# Patient Record
Sex: Female | Born: 2000 | Race: White | Hispanic: No | Marital: Single | Smoking: Never smoker
Health system: Southern US, Community
[De-identification: ages and names within clinical notes are randomized; demographics above are authoritative.]

## PROBLEM LIST (undated history)

## (undated) ENCOUNTER — Emergency Department (HOSPITAL_BASED_OUTPATIENT_CLINIC_OR_DEPARTMENT_OTHER): Discharge status: Absent without leave | Payer: BLUE CROSS/BLUE SHIELD

## (undated) DIAGNOSIS — S060XAA Concussion with loss of consciousness status unknown, initial encounter: Secondary | ICD-10-CM

## (undated) HISTORY — PX: WISDOM TOOTH EXTRACTION: SHX21

## (undated) HISTORY — DX: Concussion with loss of consciousness status unknown, initial encounter: S06.0XAA

---
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
## 2021-01-01 ENCOUNTER — Encounter: Payer: Self-pay | Admitting: *Deleted

## 2021-01-01 ENCOUNTER — Ambulatory Visit: Payer: BC Managed Care – PPO | Admitting: Neurology

## 2021-01-01 DIAGNOSIS — S060X0A Concussion without loss of consciousness, initial encounter: Secondary | ICD-10-CM

## 2021-01-01 DIAGNOSIS — R419 Unspecified symptoms and signs involving cognitive functions and awareness: Secondary | ICD-10-CM

## 2021-01-01 DIAGNOSIS — R410 Disorientation, unspecified: Secondary | ICD-10-CM | POA: Diagnosis not present

## 2021-01-01 NOTE — Telephone Encounter (Signed)
Letter given to pt today as was here for EEG.

## 2021-01-02 ENCOUNTER — Telehealth: Payer: Self-pay | Admitting: Neurology

## 2021-01-02 NOTE — Telephone Encounter (Signed)
[employer] Harvey: [id number] (exp. 01/02/21 to 01/31/21)   Patient is scheduled at [facility] for 01/03/21. Patient mom was asking if the EEG has been read yet and would like the results.   Also, the patient mom stated she would like something for her daughter to have because she may be claustrophobic for the MRI.  She is aware that the patient needs a driver.

## 2021-01-03 ENCOUNTER — Telehealth: Payer: Self-pay | Admitting: *Deleted

## 2021-01-03 ENCOUNTER — Ambulatory Visit: Payer: BC Managed Care – PPO

## 2021-01-03 DIAGNOSIS — R419 Unspecified symptoms and signs involving cognitive functions and awareness: Secondary | ICD-10-CM | POA: Diagnosis not present

## 2021-01-03 DIAGNOSIS — R42 Dizziness and giddiness: Secondary | ICD-10-CM

## 2021-01-03 DIAGNOSIS — I670 Dissection of cerebral arteries, nonruptured: Secondary | ICD-10-CM | POA: Diagnosis not present

## 2021-01-03 DIAGNOSIS — R41840 Attention and concentration deficit: Secondary | ICD-10-CM

## 2021-01-03 DIAGNOSIS — S1980XA Other specified injuries of unspecified part of neck, initial encounter: Secondary | ICD-10-CM

## 2021-01-03 DIAGNOSIS — R442 Other hallucinations: Secondary | ICD-10-CM

## 2021-01-03 DIAGNOSIS — R410 Disorientation, unspecified: Secondary | ICD-10-CM

## 2021-01-03 DIAGNOSIS — S060X0A Concussion without loss of consciousness, initial encounter: Secondary | ICD-10-CM

## 2021-01-03 DIAGNOSIS — R200 Anesthesia of skin: Secondary | ICD-10-CM

## 2021-01-03 MED ORDER — GADOBENATE DIMEGLUMINE 529 MG/ML IV SOLN
15.0000 mL | Freq: Once | INTRAVENOUS | Status: AC | PRN
  Administered 2021-01-03: 15 mL via INTRAVENOUS

## 2021-01-03 NOTE — Telephone Encounter (Signed)
EEG was Monday 10/24

## 2021-01-03 NOTE — Telephone Encounter (Signed)
-----   Message from Anson Fret, MD sent at 01/03/2021  2:12 PM EDT ----- Let patient know EEG is normal. We discussed if EEG normal, setting her up for outpatient 48-72 hour eeg. If she is still willing, can you ask her and let me know so we can place order? Thanks!

## 2021-01-03 NOTE — Procedures (Signed)
    History:  39 yof here with headaches, dizzy spells s/p fall and concussion  EEG classification: Normal awake and drowsy  Description of the recording: The background rhythms of this recording consists of a fairly well modulated medium amplitude alpha rhythm of 10-11 Hz that is reactive to eye opening and closure. As the record progresses, the patient appears to remain in the waking state throughout the recording. Photic stimulation was performed, did not show any abnormalities. Hyperventilation was also performed, did not show any abnormalities. Toward the end of the recording, the patient enters the drowsy state with slight symmetric slowing seen. The patient never enters stage II sleep. No abnormal epileptiform discharges seen during this recording. There was no focal slowing. EKG monitor shows no evidence of cardiac rhythm abnormalities with a heart rate of 104.  Impression: This is a normal EEG recording in the waking and drowsy state. No evidence of interictal epileptiform discharges seen. A normal EEG does not exclude a diagnosis of epilepsy.    Windell Norfolk, MD [facility]

## 2021-01-03 NOTE — Telephone Encounter (Signed)
I called pt and relayed results of normal EEG, and Dr. Trevor Mace message about VEEG 24-72 hour if pt is willing. Mother stated will wait to for MRI results first then will proceed if needed.

## 2022-02-02 ENCOUNTER — Encounter (HOSPITAL_BASED_OUTPATIENT_CLINIC_OR_DEPARTMENT_OTHER): Payer: Self-pay | Admitting: Emergency Medicine

## 2022-02-02 ENCOUNTER — Emergency Department (HOSPITAL_BASED_OUTPATIENT_CLINIC_OR_DEPARTMENT_OTHER)
Admission: EM | Admit: 2022-02-02 | Discharge: 2022-02-02 | Disposition: A | Discharge status: Home / Self Care | Payer: BC Managed Care – PPO | Attending: Emergency Medicine | Admitting: Emergency Medicine

## 2022-02-02 ENCOUNTER — Other Ambulatory Visit: Payer: Self-pay

## 2022-02-02 ENCOUNTER — Emergency Department (HOSPITAL_BASED_OUTPATIENT_CLINIC_OR_DEPARTMENT_OTHER): Payer: BC Managed Care – PPO

## 2022-02-02 DIAGNOSIS — R112 Nausea with vomiting, unspecified: Secondary | ICD-10-CM | POA: Diagnosis not present

## 2022-02-02 DIAGNOSIS — R1013 Epigastric pain: Secondary | ICD-10-CM | POA: Insufficient documentation

## 2022-02-02 LAB — URINALYSIS, ROUTINE W REFLEX MICROSCOPIC
Bilirubin Urine: NEGATIVE
Glucose, UA: NEGATIVE mg/dL
Ketones, ur: NEGATIVE mg/dL
Leukocytes,Ua: NEGATIVE
Nitrite: NEGATIVE
Protein, ur: NEGATIVE mg/dL
Specific Gravity, Urine: 1.02 (ref 1.005–1.030)
pH: 6.5 (ref 5.0–8.0)

## 2022-02-02 LAB — CBC
HCT: 41.5 % (ref 36.0–46.0)
Hemoglobin: 14.1 g/dL (ref 12.0–15.0)
MCH: 29.9 pg (ref 26.0–34.0)
MCHC: 34 g/dL (ref 30.0–36.0)
MCV: 88.1 fL (ref 80.0–100.0)
Platelets: 333 10*3/uL (ref 150–400)
RBC: 4.71 MIL/uL (ref 3.87–5.11)
RDW: 12.1 % (ref 11.5–15.5)
WBC: 9.7 10*3/uL (ref 4.0–10.5)
nRBC: 0 % (ref 0.0–0.2)

## 2022-02-02 LAB — COMPREHENSIVE METABOLIC PANEL WITH GFR
ALT: 11 U/L (ref 0–44)
AST: 18 U/L (ref 15–41)
Albumin: 3.8 g/dL (ref 3.5–5.0)
Alkaline Phosphatase: 43 U/L (ref 38–126)
Anion gap: 9 (ref 5–15)
BUN: 8 mg/dL (ref 6–20)
CO2: 23 mmol/L (ref 22–32)
Calcium: 8.4 mg/dL — ABNORMAL LOW (ref 8.9–10.3)
Chloride: 103 mmol/L (ref 98–111)
Creatinine, Ser: 0.61 mg/dL (ref 0.44–1.00)
GFR, Estimated: >60 mL/min
Glucose, Bld: 98 mg/dL (ref 70–99)
Potassium: 3.5 mmol/L (ref 3.5–5.1)
Sodium: 135 mmol/L (ref 135–145)
Total Bilirubin: 0.5 mg/dL (ref 0.3–1.2)
Total Protein: 7.9 g/dL (ref 6.5–8.1)

## 2022-02-02 LAB — URINALYSIS, MICROSCOPIC (REFLEX)

## 2022-02-02 LAB — LIPASE, BLOOD: Lipase: 33 U/L (ref 11–51)

## 2022-02-02 LAB — PREGNANCY, URINE: Preg Test, Ur: NEGATIVE

## 2022-02-02 MED ORDER — ALUM & MAG HYDROXIDE-SIMETH 200-200-20 MG/5ML PO SUSP
30.0000 mL | Freq: Once | ORAL | Status: AC
  Administered 2022-02-02: 30 mL via ORAL
  Filled 2022-02-02: qty 30

## 2022-02-02 MED ORDER — LACTATED RINGERS IV BOLUS
1000.0000 mL | Freq: Once | INTRAVENOUS | Status: AC
  Administered 2022-02-02: 1000 mL via INTRAVENOUS

## 2022-02-02 MED ORDER — IOHEXOL 300 MG/ML  SOLN
100.0000 mL | Freq: Once | INTRAMUSCULAR | Status: AC | PRN
  Administered 2022-02-02: 100 mL via INTRAVENOUS

## 2022-02-02 MED ORDER — FENTANYL CITRATE PF 50 MCG/ML IJ SOSY
50.0000 ug | PREFILLED_SYRINGE | Freq: Once | INTRAMUSCULAR | Status: AC
  Administered 2022-02-02: 50 ug via INTRAVENOUS
  Filled 2022-02-02: qty 1

## 2022-02-02 MED ORDER — MORPHINE SULFATE (PF) 4 MG/ML IV SOLN
4.0000 mg | Freq: Once | INTRAVENOUS | Status: AC
  Administered 2022-02-02: 4 mg via INTRAVENOUS
  Filled 2022-02-02: qty 1

## 2022-02-02 MED ORDER — KETOROLAC TROMETHAMINE 30 MG/ML IJ SOLN
15.0000 mg | Freq: Once | INTRAMUSCULAR | Status: AC
  Administered 2022-02-02: 15 mg via INTRAVENOUS
  Filled 2022-02-02: qty 1

## 2022-02-02 MED ORDER — DICYCLOMINE HCL 10 MG PO CAPS
10.0000 mg | ORAL_CAPSULE | Freq: Once | ORAL | Status: AC
  Administered 2022-02-02: 10 mg via ORAL
  Filled 2022-02-02: qty 1

## 2022-02-02 MED ORDER — ONDANSETRON HCL 4 MG/2ML IJ SOLN
4.0000 mg | Freq: Once | INTRAMUSCULAR | Status: AC
  Administered 2022-02-02: 4 mg via INTRAVENOUS
  Filled 2022-02-02: qty 2

## 2022-02-02 MED ORDER — DICYCLOMINE HCL 20 MG PO TABS: 20.0000 mg | ORAL_TABLET | Freq: Two times a day (BID) | ORAL | 0 refills | Status: AC | PRN

## 2022-02-02 MED ORDER — ALUM & MAG HYDROXIDE-SIMETH 400-400-40 MG/5ML PO SUSP: 15.0000 mL | Freq: Four times a day (QID) | ORAL | 0 refills | Status: AC | PRN

## 2022-02-02 NOTE — ED Provider Notes (Signed)
[facility] EMERGENCY DEPARTMENT Provider Note   CSN: [id number] Arrival date & time: 02/02/22  0038     History  Chief Complaint  Patient presents with   Abdominal Pain    Victoria Hensley is a 21 y.o. female.  21 yo F here with abdominal pain. Started yesterday morning, took meds and got better. Ate and drank throughout the day and did well but then pain returned last night. Epigastric wrapping around to her right side. Some nausea, and vomited after supper. No fevers but had an episode of chills. Now with persistent discomfort. No diarrhea. Last period 3 weeks ago and is on a different contraceptive.    Abdominal Pain      Home Medications Prior to Admission medications   Medication Sig Start Date End Date Taking? Authorizing Provider  alum & mag hydroxide-simeth (MAALOX PLUS) 400-400-40 MG/5ML suspension Take 15 mLs by mouth every 6 (six) hours as needed for indigestion. 02/02/22  Yes Jaisha Villacres, Barbara Cower, MD  dicyclomine (BENTYL) 20 MG tablet Take 1 tablet (20 mg total) by mouth 2 (two) times daily as needed for spasms (abdominal cramping). 02/02/22  Yes Osborn Pullin, Barbara Cower, MD  TRI-LO-MILI 0.18/0.215/0.25 MG-25 MCG tab Take 1 tablet by mouth daily. 02/01/22  Yes [provider]  ALPRAZolam (XANAX) 0.25 MG tablet Take 1-2 tabs (0.25mg -0.50mg ) 30-60 minutes before procedure. May repeat if needed.Do not drive. 12/26/20   Anson Fret, MD  loratadine (CLARITIN) 10 MG tablet Take 1 tablet (10 mg total) by mouth daily. 07/28/13   Carmelina Dane, MD  ofloxacin (FLOXIN) 0.3 % otic solution Place 5 drops into the right ear 2 (two) times daily. 07/28/13   Carmelina Dane, MD  ondansetron (ZOFRAN) 4 MG tablet Take 1 tablet (4 mg total) by mouth every 6 (six) hours. 09/07/20   Curatolo, Adam, DO  pantoprazole (PROTONIX) 20 MG tablet Take 1 tablet (20 mg total) by mouth daily for 14 days. 09/07/20 09/21/20  Curatolo, Adam, DO  polyethylene glycol (MIRALAX / GLYCOLAX) 17 g  packet Take 17 g by mouth daily. 09/07/20   Curatolo, Adam, DO      Allergies    Amoxil [amoxicillin]    Review of Systems   Review of Systems  Gastrointestinal:  Positive for abdominal pain.    Physical Exam Updated Vital Signs BP 122/64 (BP Location: Left Arm)   Pulse (!) 24   Temp 98.6 F (37 C) (Oral)   Resp 17   Ht 5\' 8"  (1.727 m)   Wt 79.4 kg   LMP 01/05/2022 (Approximate)   SpO2 99%   BMI 26.61 kg/m  Physical Exam Vitals and nursing note reviewed.  Constitutional:      Appearance: She is well-developed.  HENT:     Head: Normocephalic and atraumatic.  Cardiovascular:     Rate and Rhythm: Normal rate and regular rhythm.  Pulmonary:     Effort: Pulmonary effort is normal. No respiratory distress.     Breath sounds: No stridor.  Abdominal:     General: There is no distension.     Tenderness: There is no abdominal tenderness.     Hernia: No hernia is present.  Musculoskeletal:     Cervical back: Normal range of motion.  Skin:    General: Skin is warm and dry.  Neurological:     Mental Status: She is alert.     ED Results / Procedures / Treatments   Labs (all labs ordered are listed, but only abnormal results are displayed)  Labs Reviewed  COMPREHENSIVE METABOLIC PANEL - Abnormal; Notable for the following components:      Result Value   Calcium 8.4 (*)    All other components within normal limits  URINALYSIS, ROUTINE W REFLEX MICROSCOPIC - Abnormal; Notable for the following components:   Hgb urine dipstick TRACE (*)    All other components within normal limits  URINALYSIS, MICROSCOPIC (REFLEX) - Abnormal; Notable for the following components:   Bacteria, UA FEW (*)    All other components within normal limits  LIPASE, BLOOD  CBC  PREGNANCY, URINE    EKG None  Radiology CT ABDOMEN PELVIS W CONTRAST  Result Date: 02/02/2022 CLINICAL DATA:  Acute abdominal pain and chills for several hours, initial encounter EXAM: CT ABDOMEN AND PELVIS WITH  CONTRAST TECHNIQUE: Multidetector CT imaging of the abdomen and pelvis was performed using the standard protocol following bolus administration of intravenous contrast. RADIATION DOSE REDUCTION: This exam was performed according to the departmental dose-optimization program which includes automated exposure control, adjustment of the mA and/or kV according to patient size and/or use of iterative reconstruction technique. CONTRAST:  OMNIPAQUE IOHEXOL 300 MG/ML  SOLN COMPARISON:  09/07/2020 FINDINGS: Lower chest: No acute abnormality. Hepatobiliary: No focal liver abnormality is seen. No gallstones, gallbladder wall thickening, or biliary dilatation. Pancreas: Unremarkable. No pancreatic ductal dilatation or surrounding inflammatory changes. Spleen: Normal in size without focal abnormality. Adrenals/Urinary Tract: Adrenal glands are within normal limits. Kidneys are well visualized bilaterally. Normal enhancement is noted. No renal calculi or obstructive changes are seen. The bladder is well distended. Stomach/Bowel: The appendix is well visualized and within normal limits. No Peri appendiceal inflammatory changes are seen. The colon shows no obstructive or inflammatory changes. The stomach is decompressed. Visualized small bowel shows no obstructive changes. Vascular/Lymphatic: No significant vascular findings are present. No enlarged abdominal or pelvic lymph nodes. Reproductive: Uterus and bilateral adnexa are unremarkable. Other: No abdominal wall hernia or abnormality. No abdominopelvic ascites. Musculoskeletal: No acute or significant osseous findings. IMPRESSION: No acute abnormality noted to correspond with the given clinical history. Specifically the appendix is within normal limits. Electronically Signed   By: Alcide Clever M.D.   On: 02/02/2022 02:47    Procedures Procedures    Medications Ordered in ED Medications  lactated ringers bolus 1,000 mL ( Intravenous Stopped 02/02/22 0235)  morphine  (PF) 4 MG/ML injection 4 mg (4 mg Intravenous Given 02/02/22 0126)  ondansetron (ZOFRAN) injection 4 mg (4 mg Intravenous Given 02/02/22 0121)  dicyclomine (BENTYL) capsule 10 mg (10 mg Oral Given 02/02/22 0234)  fentaNYL (SUBLIMAZE) injection 50 mcg (50 mcg Intravenous Given 02/02/22 0233)  iohexol (OMNIPAQUE) 300 MG/ML solution 100 mL (100 mLs Intravenous Contrast Given 02/02/22 0234)  alum & mag hydroxide-simeth (MAALOX/MYLANTA) 200-200-20 MG/5ML suspension 30 mL (30 mLs Oral Given 02/02/22 0315)  ketorolac (TORADOL) 30 MG/ML injection 15 mg (15 mg Intravenous Given 02/02/22 0315)    ED Course/ Medical Decision Making/ A&P                           Medical Decision Making Amount and/or Complexity of Data Reviewed Labs: ordered. Radiology: ordered.  Risk OTC drugs. Prescription drug management.   Unclear if allergy for symptoms.  Her CT scan was interpreted by myself with no evidence of gallbladder distention, pancreatitis, appendicitis or other obvious etiologies.  Labs are reassuring.  Symptoms ultimately improved with Bentyl and Maalox.  Will discharge on same.  She will follow-up  with PCP if pain returns follow-up here if new symptoms.  Final Clinical Impression(s) / ED Diagnoses Final diagnoses:  Epigastric pain    Rx / DC Orders ED Discharge Orders          Ordered    dicyclomine (BENTYL) 20 MG tablet  2 times daily PRN        02/02/22 0359    alum & mag hydroxide-simeth (MAALOX PLUS) 400-400-40 MG/5ML suspension  Every 6 hours PRN        02/02/22 0359              Ty Oshima, Barbara Cower, MD 02/02/22 [id number]

## 2022-02-02 NOTE — ED Triage Notes (Signed)
Pt abd pain and chills since 11a took Advil at 3p felt better when to dinner and threw up dinner and felt worse had BM at 9pm and pain started. Epigastric pain radiates down to hips.

## 2022-03-19 ENCOUNTER — Other Ambulatory Visit: Payer: Self-pay | Admitting: Gastroenterology

## 2022-03-19 DIAGNOSIS — R1011 Right upper quadrant pain: Secondary | ICD-10-CM

## 2022-03-19 DIAGNOSIS — R110 Nausea: Secondary | ICD-10-CM

## 2022-04-10 ENCOUNTER — Ambulatory Visit
Admission: RE | Admit: 2022-04-10 | Discharge: 2022-04-10 | Disposition: A | Discharge status: Home / Self Care | Payer: BC Managed Care – PPO | Source: Ambulatory Visit | Attending: Gastroenterology | Admitting: Gastroenterology

## 2022-04-10 DIAGNOSIS — R1011 Right upper quadrant pain: Secondary | ICD-10-CM

## 2022-04-10 DIAGNOSIS — R110 Nausea: Secondary | ICD-10-CM

## 2022-07-21 IMAGING — CT CT ABD-PELV W/O CM
2 of 4 series · 16 of 46 positions shown, 18 images · non-contrast
Comparison: None.

CLINICAL DATA: Abdominal pain

EXAM:
CT ABDOMEN AND PELVIS WITHOUT CONTRAST
TECHNIQUE: Multidetector CT imaging of the abdomen and pelvis was performed
following the standard protocol without IV contrast.

[Series 2: axial st · axial · 0.95mm/px · z∈[+722,+1152]mm · 13 of 94 slices shown, 15 images]
[im 4/94  soft-tissue]
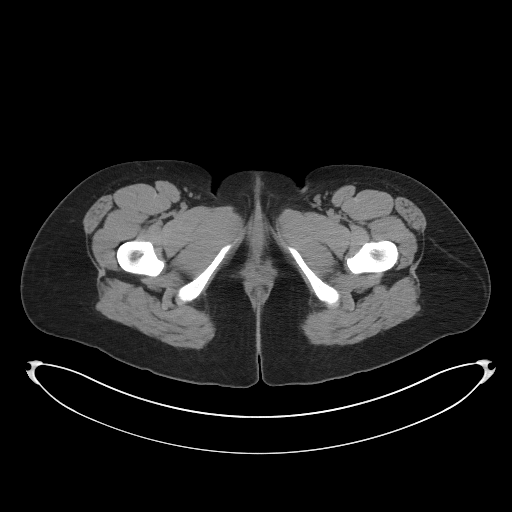
[im 4/94  bone]
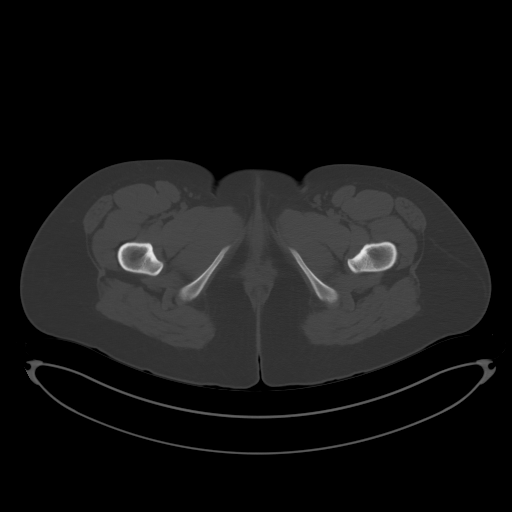
[im 12/94  soft-tissue]
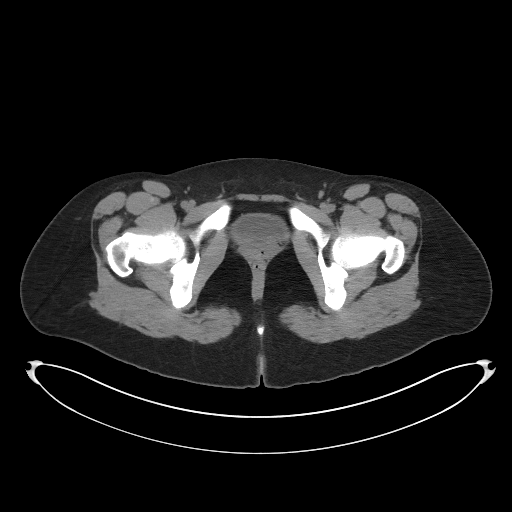
[im 20/94  soft-tissue]
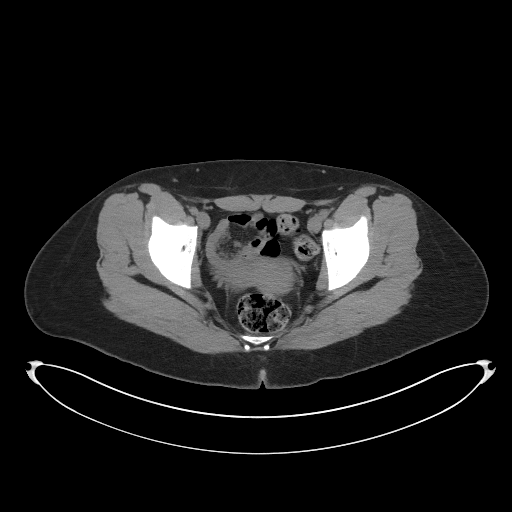
[im 28/94  soft-tissue]
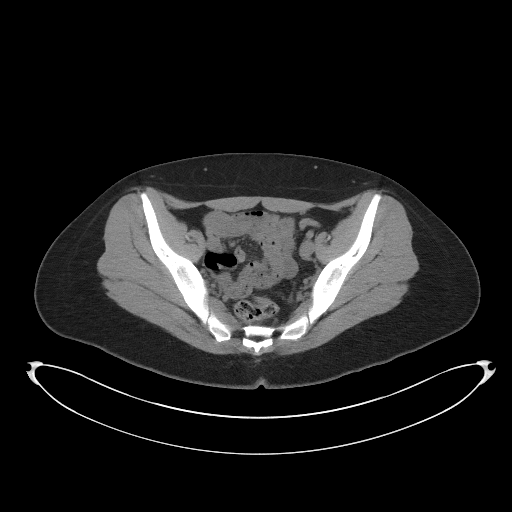
[im 32/94  soft-tissue]
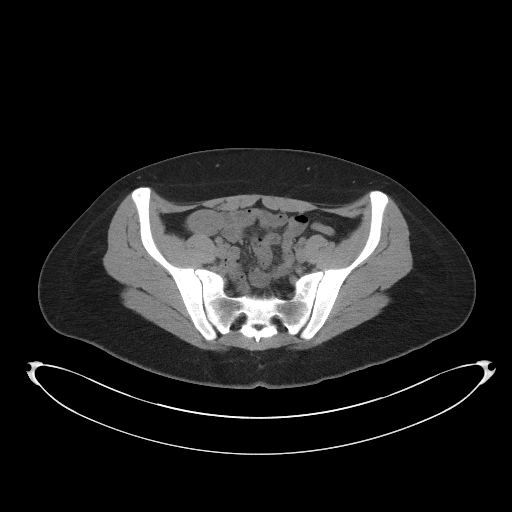
[im 39/94  soft-tissue]
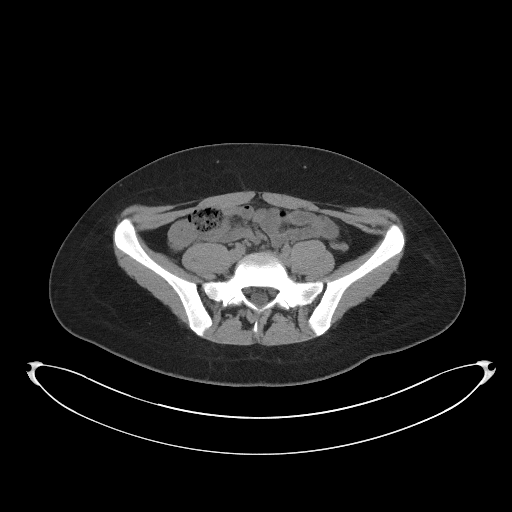
[im 47/94  soft-tissue]
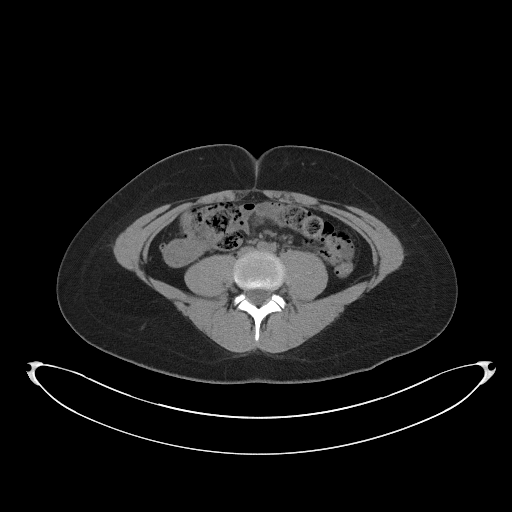
[im 55/94  soft-tissue]
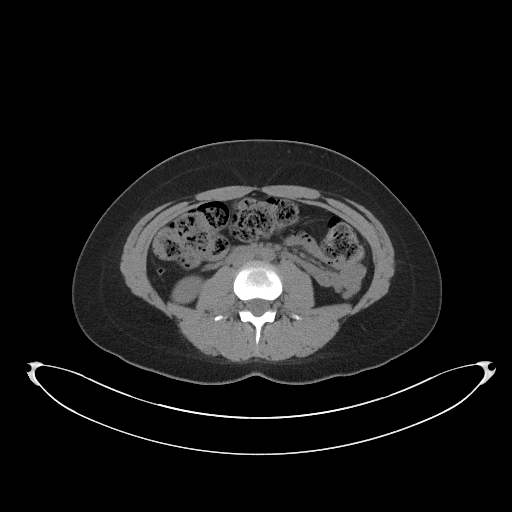
[im 63/94  soft-tissue]
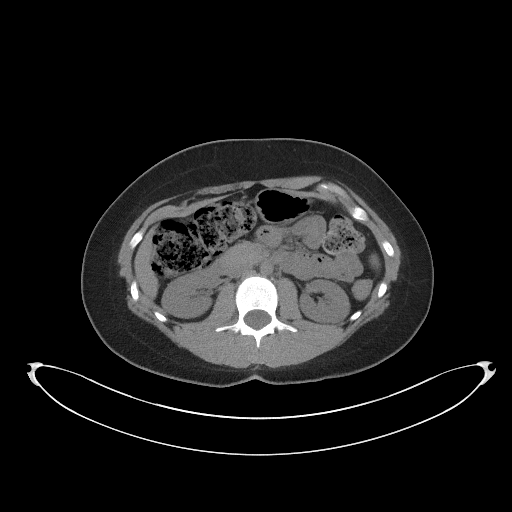
[im 63/94  bone]
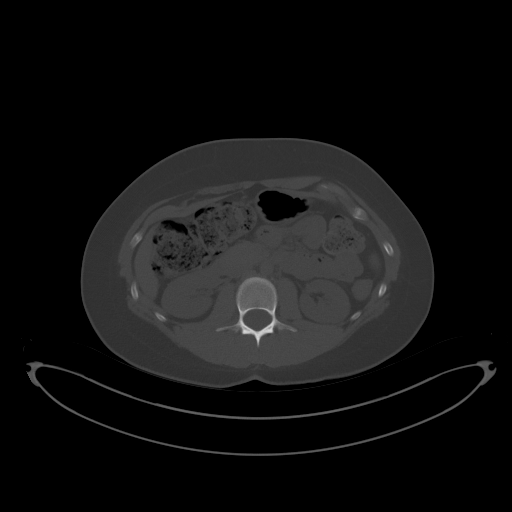
[im 66/94  soft-tissue]
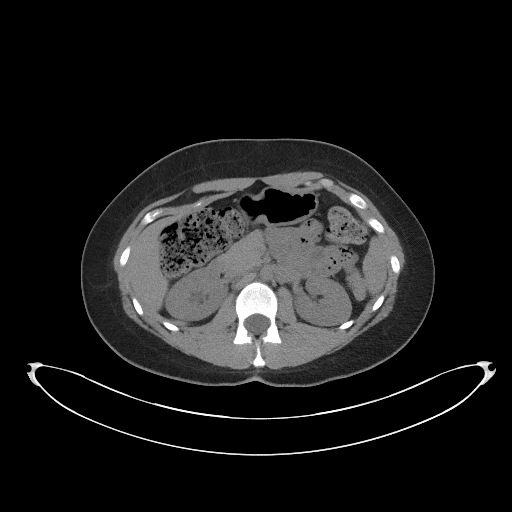
[im 74/94  soft-tissue]
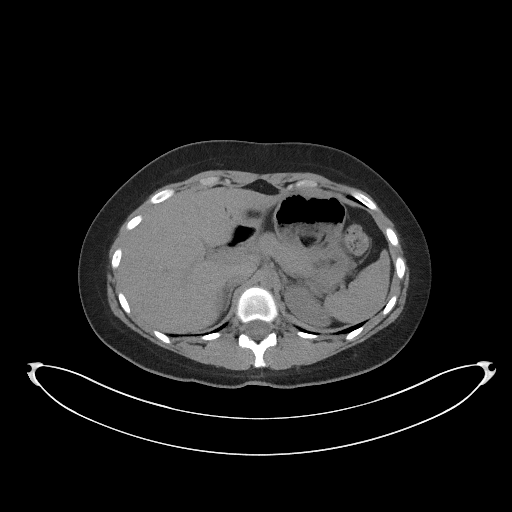
[im 82/94  soft-tissue]
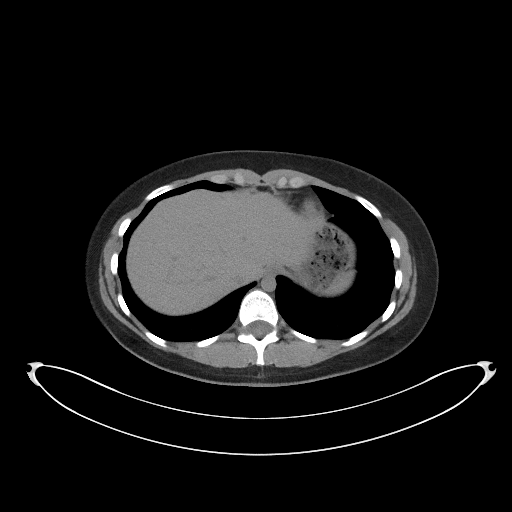
[im 90/94  soft-tissue]
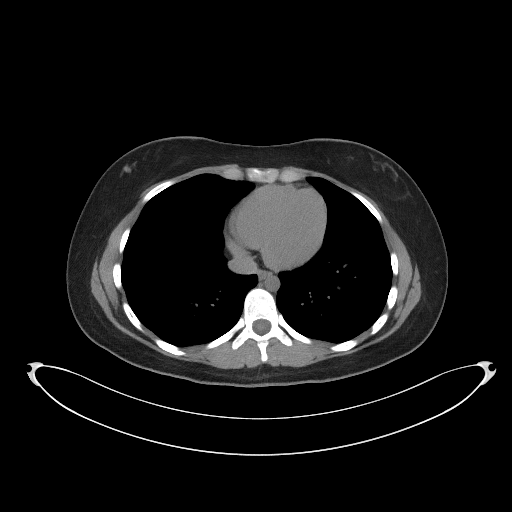

[Series 5: coronal st · coronal · 0.70mm/px · 3 of 100 slices shown]
[im 34/100  soft-tissue]
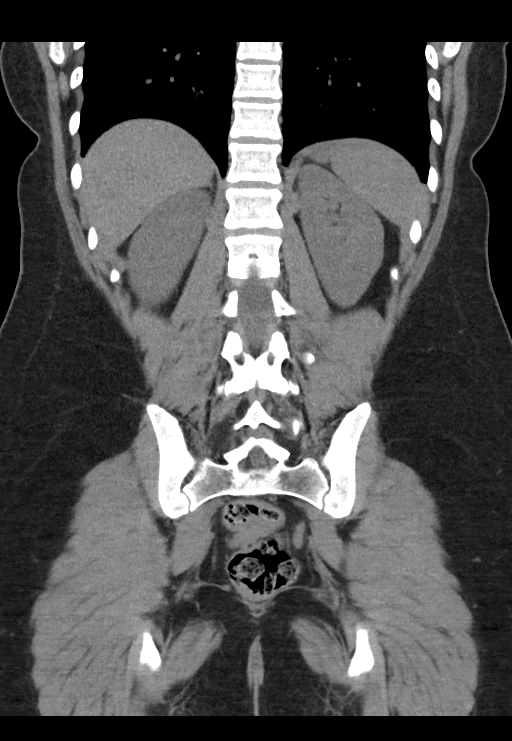
[im 45/100  soft-tissue]
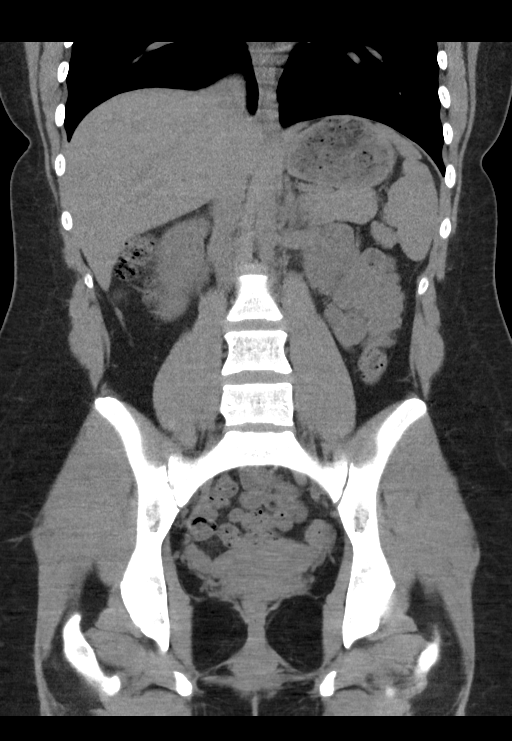
[im 56/100  soft-tissue]
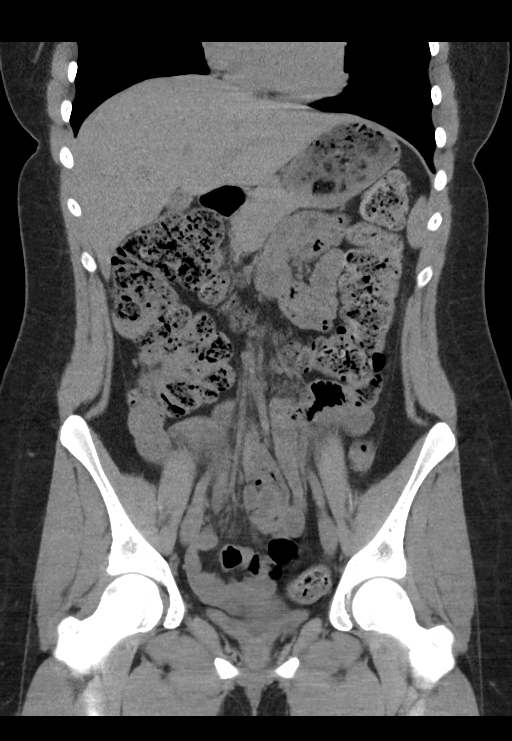

[16 of 46 positions shown; findings below may reference images not displayed]

FINDINGS: Lower chest: Lung bases are clear. No effusions. Heart is normal
size.

Hepatobiliary: No focal hepatic abnormality. Gallbladder
unremarkable.

Pancreas: No focal abnormality or ductal dilatation.

Spleen: No focal abnormality.  Normal size.

Adrenals/Urinary Tract: No adrenal abnormality. No focal renal
abnormality. No stones or hydronephrosis. Urinary bladder is
unremarkable.

Stomach/Bowel: Stomach, large and small bowel grossly unremarkable.
Normal appendix. Moderate stool burden throughout the colon.

Vascular/Lymphatic: No evidence of aneurysm or adenopathy.

Reproductive: Uterus and adnexa unremarkable.  No mass.

Other: No free fluid or free air.

Musculoskeletal: No acute bony abnormality.
IMPRESSION: Moderate stool burden throughout the colon.

Normal appendix.

No acute findings.
